# Patient Record
Sex: Female | Born: 1960 | Hispanic: No | Marital: Married | Smoking: Never smoker
Health system: Southern US, Community
[De-identification: ages and names within clinical notes are randomized; demographics above are authoritative.]

---

## 2013-08-28 ENCOUNTER — Encounter (HOSPITAL_COMMUNITY): Payer: Self-pay | Admitting: Emergency Medicine

## 2013-08-28 ENCOUNTER — Emergency Department (HOSPITAL_COMMUNITY): Payer: BC Managed Care – PPO

## 2013-08-28 ENCOUNTER — Emergency Department (HOSPITAL_COMMUNITY)
Admission: EM | Admit: 2013-08-28 | Discharge: 2013-08-28 | Disposition: A | Discharge status: Home / Self Care | Payer: BC Managed Care – PPO | Attending: Emergency Medicine | Admitting: Emergency Medicine

## 2013-08-28 DIAGNOSIS — Y9373 Activity, racquet and hand sports: Secondary | ICD-10-CM | POA: Insufficient documentation

## 2013-08-28 DIAGNOSIS — S02610A Fracture of condylar process of mandible, unspecified side, initial encounter for closed fracture: Secondary | ICD-10-CM | POA: Insufficient documentation

## 2013-08-28 DIAGNOSIS — Y92838 Other recreation area as the place of occurrence of the external cause: Secondary | ICD-10-CM

## 2013-08-28 DIAGNOSIS — S02609A Fracture of mandible, unspecified, initial encounter for closed fracture: Secondary | ICD-10-CM

## 2013-08-28 DIAGNOSIS — Y9239 Other specified sports and athletic area as the place of occurrence of the external cause: Secondary | ICD-10-CM | POA: Insufficient documentation

## 2013-08-28 DIAGNOSIS — W219XXA Striking against or struck by unspecified sports equipment, initial encounter: Secondary | ICD-10-CM | POA: Insufficient documentation

## 2013-08-28 MED ORDER — DIAZEPAM 5 MG PO TABS: 5.0000 mg | ORAL_TABLET | Freq: Three times a day (TID) | ORAL | Status: AC | PRN

## 2013-08-28 MED ORDER — HYDROCODONE-ACETAMINOPHEN 5-325 MG PO TABS: 1.0000 | ORAL_TABLET | Freq: Four times a day (QID) | ORAL | Status: AC | PRN

## 2013-08-28 NOTE — ED Provider Notes (Signed)
CSN: [id number]     Arrival date & time 08/28/13  1939 History  This chart was scribed for non-physician practitioner, Jaynie Crumble, PA-C working with Glynn Octave, MD by Greggory Stallion, ED scribe. This patient was seen in room TR10C/TR10C and the patient's care was started at 8:37 PM.    Chief Complaint  Patient presents with  . Jaw Pain   The history is provided by the patient. No language interpreter was used.   HPI Comments: Anne Gomez is a 53 y.o. female who presents to the Emergency Department complaining of worsening right jaw pain and swelling that started 2 days ago. Pt was playing tennis when she was struck in her jaw. Denies LOC. Opening and closing her mouth worsen the pain. She has taken ibuprofen with no relief. Pt states her teeth are lined up normally and denies any other dental problems.   History reviewed. No pertinent past medical history. History reviewed. No pertinent past surgical history. No family history on file. History  Substance Use Topics  . Smoking status: Never Smoker   . Smokeless tobacco: Not on file  . Alcohol Use: Yes     Comment: occasional   OB History   Grav Para Term Preterm Abortions TAB SAB Ect Mult Living                 Review of Systems  HENT: Positive for facial swelling. Negative for dental problem.   Musculoskeletal: Positive for arthralgias (jaw).  All other systems reviewed and are negative.   Allergies  Review of patient's allergies indicates not on file.  Home Medications  No current outpatient prescriptions on file.  BP 128/76  Pulse 86  Temp(Src) 98.1 F (36.7 C) (Oral)  Resp 17  Ht 5\' 5"  (1.651 m)  Wt 144 lb 3 oz (65.403 kg)  BMI 23.99 kg/m2  SpO2 100%  Physical Exam  Nursing note and vitals reviewed. Constitutional: She is oriented to person, place, and time. She appears well-developed and well-nourished. No distress.  HENT:  Head: Normocephalic and atraumatic.  Right Ear: Tympanic membrane and ear  canal normal.  Left Ear: Tympanic membrane and ear canal normal.  Mild trismus, able to open mouth to two finger width. Tender over right lower mandible and right mastoid process. Normal teeth alignment.   Eyes: EOM are normal.  Neck: Neck supple. No tracheal deviation present.  Cardiovascular: Normal rate.   Pulmonary/Chest: Effort normal. No respiratory distress.  Musculoskeletal: Normal range of motion.  Neurological: She is alert and oriented to person, place, and time.  Skin: Skin is warm and dry.  Psychiatric: She has a normal mood and affect. Her behavior is normal.    ED Course  Procedures (including critical care time)  DIAGNOSTIC STUDIES: Oxygen Saturation is 100% on RA, normal by my interpretation.    COORDINATION OF CARE: 8:39 PM-Discussed treatment plan which includes CT scan with pt at bedside and pt agreed to plan.   8:42 PM-Spoke to Dr. Register in radiology. He suggested a head CT and maxillofacial CT given both tenderness over mandible and mastoid process  Labs Review Labs Reviewed - No data to display Imaging Review Ct Head Wo Contrast  08/28/2013   CLINICAL DATA:  Right jaw pain  EXAM: CT HEAD WITHOUT CONTRAST  CT MAXILLOFACIAL WITHOUT CONTRAST  TECHNIQUE: Multidetector CT imaging of the head and maxillofacial structures were performed using the standard protocol without intravenous contrast. Multiplanar CT image reconstructions of the maxillofacial structures were also generated.  COMPARISON:  None.  FINDINGS: CT HEAD FINDINGS  There is no evidence of mass effect, midline shift or extra-axial fluid collections. There is no evidence of a space-occupying lesion or intracranial hemorrhage. There is no evidence of a cortical-based area of acute infarction.  The ventricles and sulci are appropriate for the patient's age. The basal cisterns are patent.  Visualized portions of the orbits are unremarkable. The visualized portions of the paranasal sinuses and mastoid air cells  are unremarkable.  The osseous structures are unremarkable.  CT MAXILLOFACIAL FINDINGS  The globes are intact. The orbital walls are intact. The orbital floors are intact. The maxilla is intact. The mandible is intact. The zygomatic arches are intact. The nasal septum is midline. There is no nasal bone fracture. There is a nondisplaced fracture of the right mandibular condylar neck without extension to the articular surface. The temporomandibular joints are congruent.  There is moderate right facet arthropathy at C3-4.  The paranasal sinuses are clear. The visualized portions of the mastoid sinuses are well aerated.  IMPRESSION: 1. No acute intracranial pathology. 2. Nondisplaced fracture of the right mandibular condylar neck. The fracture does not involve the articular surface.   Electronically Signed   By: Elige KoHetal  Patel   On: 08/28/2013 21:55   Ct Maxillofacial Wo Cm  08/28/2013   CLINICAL DATA:  Right jaw pain  EXAM: CT HEAD WITHOUT CONTRAST  CT MAXILLOFACIAL WITHOUT CONTRAST  TECHNIQUE: Multidetector CT imaging of the head and maxillofacial structures were performed using the standard protocol without intravenous contrast. Multiplanar CT image reconstructions of the maxillofacial structures were also generated.  COMPARISON:  None.  FINDINGS: CT HEAD FINDINGS  There is no evidence of mass effect, midline shift or extra-axial fluid collections. There is no evidence of a space-occupying lesion or intracranial hemorrhage. There is no evidence of a cortical-based area of acute infarction.  The ventricles and sulci are appropriate for the patient's age. The basal cisterns are patent.  Visualized portions of the orbits are unremarkable. The visualized portions of the paranasal sinuses and mastoid air cells are unremarkable.  The osseous structures are unremarkable.  CT MAXILLOFACIAL FINDINGS  The globes are intact. The orbital walls are intact. The orbital floors are intact. The maxilla is intact. The mandible is  intact. The zygomatic arches are intact. The nasal septum is midline. There is no nasal bone fracture. There is a nondisplaced fracture of the right mandibular condylar neck without extension to the articular surface. The temporomandibular joints are congruent.  There is moderate right facet arthropathy at C3-4.  The paranasal sinuses are clear. The visualized portions of the mastoid sinuses are well aerated.  IMPRESSION: 1. No acute intracranial pathology. 2. Nondisplaced fracture of the right mandibular condylar neck. The fracture does not involve the articular surface.   Electronically Signed   By: Elige KoHetal  Patel   On: 08/28/2013 21:55     EKG Interpretation None      MDM   Final diagnoses:  Mandible fracture    Patient with a tennis ball injury to the right mandible 2 days ago. Patient with continued pain. She has some trismus present. Patient unable to eat solid foods. Will get CT head and maxillofacial bones, I discussed this with radiologist Dr. register, who recommended given both given location of her tenderness.   Patient's CT scan back showing nondisplaced fracture of the right mandibular condylar neck. I discussed case with Dr.Thimmappa, who is on call for maxillofacial trauma. She has instructed me to provide patient with  pain medications, no solid foods, followup with her closely for recheck, possible surgical repair. I discussed this with patient and her husband. They both agree to the plan. Patient was prescribed Norco for pain and Valium for muscle spasms.  Filed Vitals:   08/28/13 1949 08/28/13 2206  BP: 128/76 110/78  Pulse: 86 68  Temp: 98.1 F (36.7 C) 98.2 F (36.8 C)  TempSrc: Oral Oral  Resp: 17 16  Height: 5\' 5"  (1.651 m)   Weight: 144 lb 3 oz (65.403 kg)   SpO2: 100% 100%    I personally performed the services described in this documentation, which was scribed in my presence. The recorded information has been reviewed and is accurate.   Lottie Mussel, PA-C 08/28/13 2343

## 2013-08-28 NOTE — ED Provider Notes (Signed)
Medical screening examination/treatment/procedure(s) were performed by non-physician practitioner and as supervising physician I was immediately available for consultation/collaboration.   EKG Interpretation None        Eliora Nienhuis, MD 08/28/13 2345 

## 2013-08-28 NOTE — ED Notes (Signed)
Pt st's she was playing tennis 2 days ago and was struck in right jaw with tennis ball.  Since then pt has had pain to jaw.  Painful to open and close mouth and to eat.  Denies any loose teeth

## 2013-08-28 NOTE — Discharge Instructions (Signed)
Avoid any hard foods. Ibuprofen for pain. norco for severe pain. Valium for spasms. Follow up with Dr. Leta Baptisthimmappa, call tomorrow.     Fractured Jaw Diet This diet should be used after jaw or mouth surgery, wired jaw surgery, or dental surgery. The consistency of foods in this diet should be thin enough to be sipped from a straw or given through a syringe. It is important to consume enough calories and protein to prevent weight loss and to promote healing after surgery. You will need to have 3 meals and 3 snacks daily. It is important to eat from a variety of food groups. Foods you normally eat may be blended to the correct texture. INSTRUCTIONS FOR BLENDING FOODS  Prepare foods by removing skins, seeds, and peels.  Cook meats and vegetables thoroughly. Avoid using raw eggs. Powdered or pasteurized egg mixtures may be used.  Cut foods into small pieces and mix with a small amount of liquid in a food processor or blender. Continue to add liquids until foods become thin enough to sip through a straw.  Add juice, milk, cream, broth, gravy, or vegetable juice to add flavor and to thin foods.  Blending foods sometimes causes air bubbles that may not be desirable. Heating foods after blending will reduce the foam produced from blending. TIPS  If you are losing weight, you may need to add extra calories to your food by:  Adding powdered milk or protein powder to food.  Adding extra fats, such as tub margarine, sour cream, cream cheese, cream, nut butters (such as peanut butter or almond butter), and half-and-half.  Adding sweets, such as honey, ice cream, black strap molasses, or sugar.  Your teeth and mouth may be sensitive to extreme temperatures. Heat or cool your foods only to lukewarm or cool temperatures.  Stock Water quality scientistyour pantry with a variety of liquid nutritional supplement drinks.  Take a liquid multivitamin to ensure you are getting adequate nutrients.  Use baby food if you are short on  time or energy. FOOD CHOICES ALL FOODS MUST BE BLENDED. Starches 4 servings or more  Hot cereals, such as oatmeal, grits, ground wheat cereals, and polenta.  Mashed potatoes. Vegetables 3 servings or more  All vegetables and vegetable juices. Fruit 2 servings or more  All fruits and fruit juices. Meat and Meat Substitutes 2 servings or more (6 oz per day)  Soft-boiled eggs, scrambled eggs, cottage cheese, cheese sauce.  Ground meats, such as hamburger, [place]urkey, sausage, meatloaf.  Custard, baby food meat. Milk 2 cups or equivalent  Liquid, powdered, or evaporated milk.  Buttermilk or chocolate milk.  Drinkable yogurt.  Sherbert, ice cream, milkshakes, eggnog, pudding, and liquid supplements. Beverages  Coffee (regular or decaffeinated), tea, and mineral water.  Liquid supplements. Condiments  All seasonings and condiments that blend well. Document Released: 11/18/2009 Document Revised: 08/23/2011 Document Reviewed: 11/18/2009 [facility]ExitCare Patient Information 2014 [employer] [place].

## 2014-11-26 IMAGING — CT CT HEAD W/O CM
3 of 5 series · 16 of 47 positions shown, 19 images · non-contrast
Comparison: None.

CLINICAL DATA: Right jaw pain

EXAM:
CT HEAD WITHOUT CONTRAST
CT MAXILLOFACIAL WITHOUT CONTRAST
TECHNIQUE: Multidetector CT imaging of the head and maxillofacial structures
were performed using the standard protocol without intravenous
contrast. Multiplanar CT image reconstructions of the maxillofacial
structures were also generated.

[Series 4: facial/ orbits 2.0 h30s · axial · 0.38mm/px · z∈[-244,-108]mm · 10 of 82 slices shown, 13 images]
[im 7/82  brain]
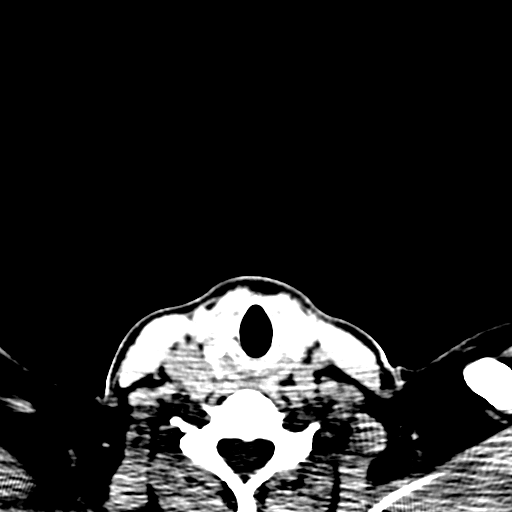
[im 7/82  bone]
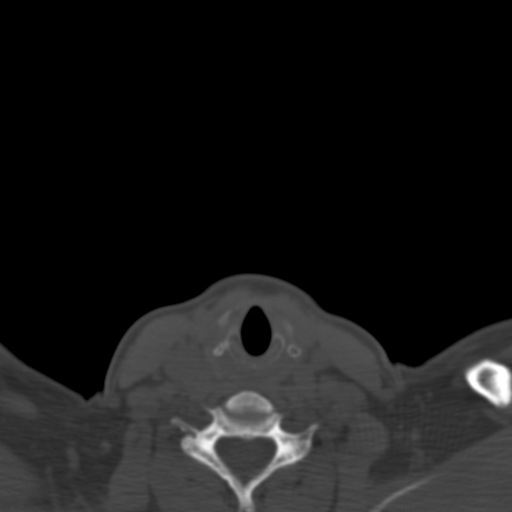
[im 14/82  brain]
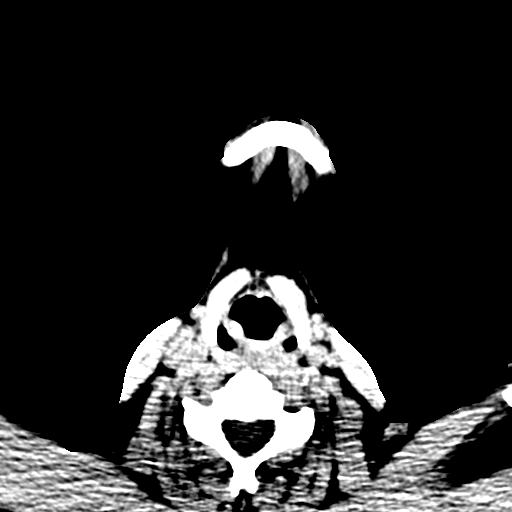
[im 21/82  brain]
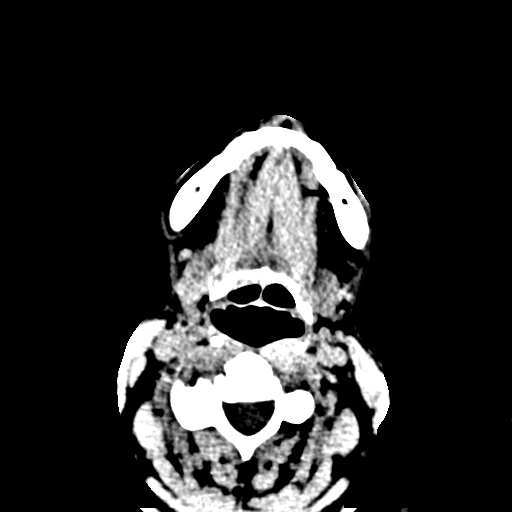
[im 28/82  brain]
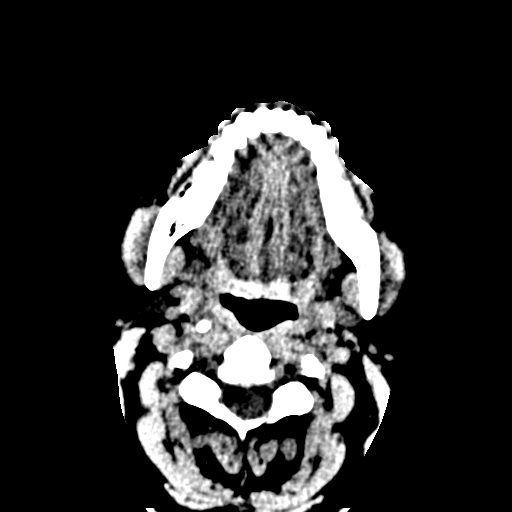
[im 34/82  brain]
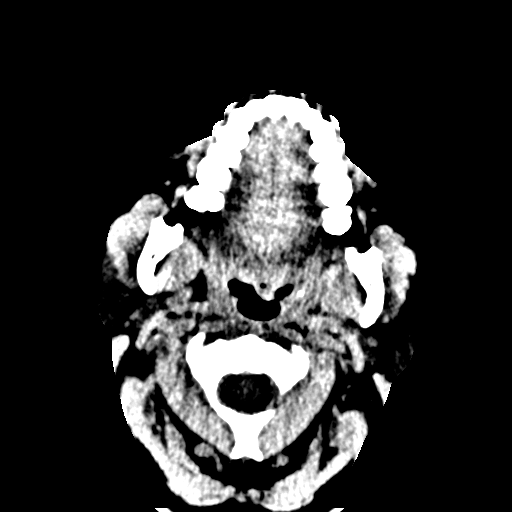
[im 34/82  bone]
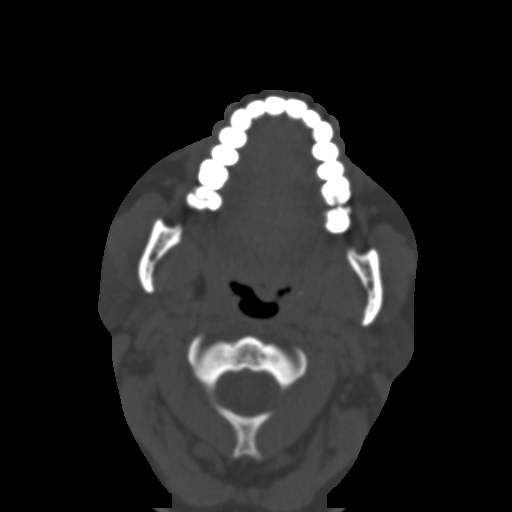
[im 48/82  brain]
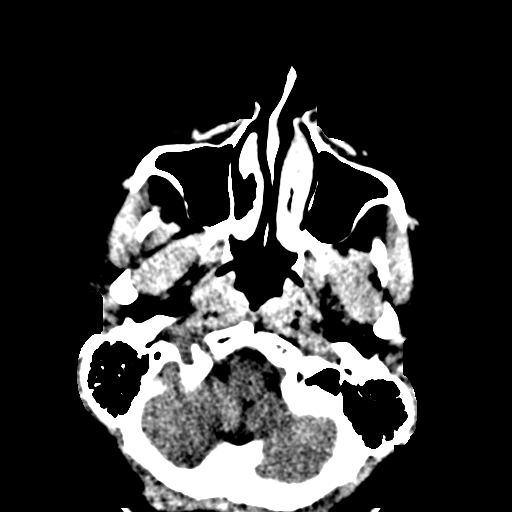
[im 55/82  brain]
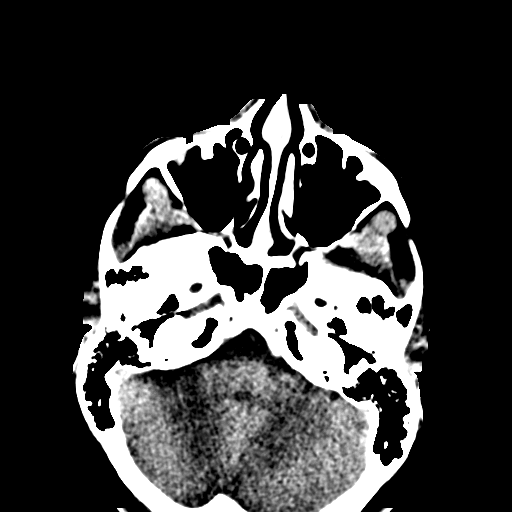
[im 61/82  brain]
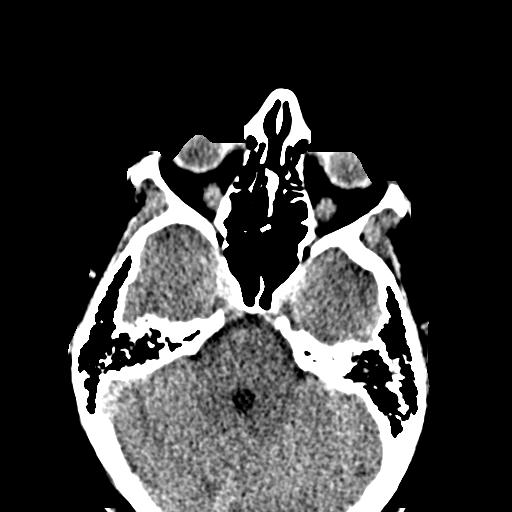
[im 68/82  brain]
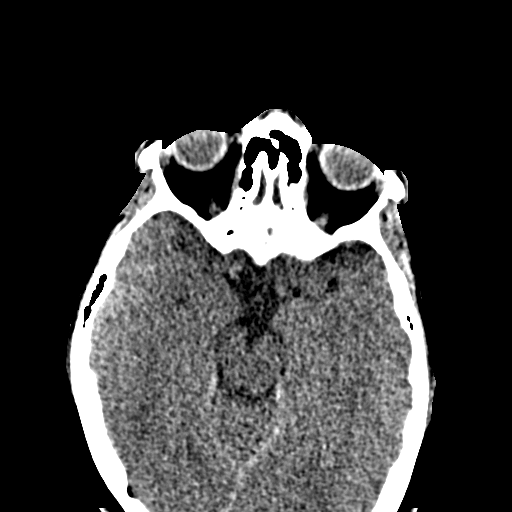
[im 68/82  bone]
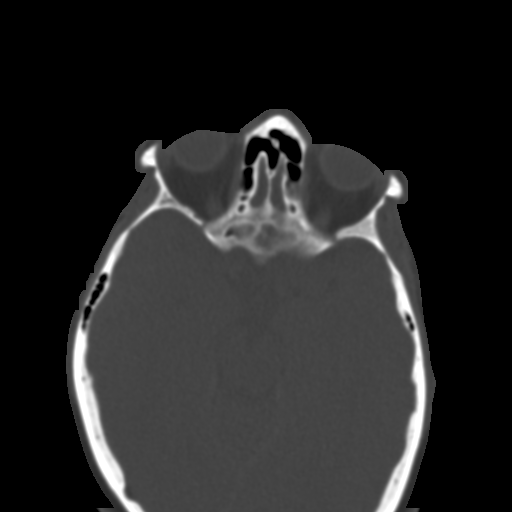
[im 75/82  brain]
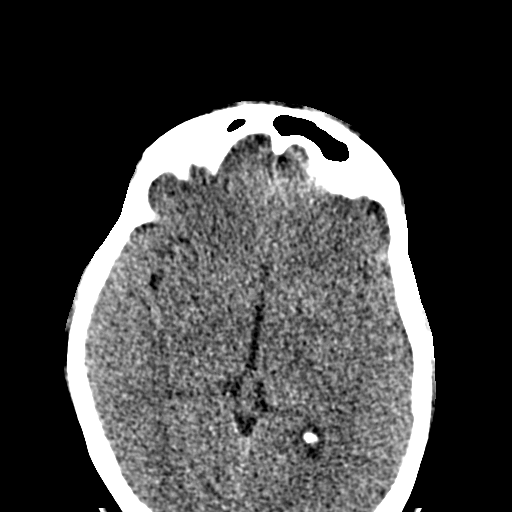

[Series 602: st cor · coronal · 0.38mm/px · 3 of 88 slices shown]
[im 30/88  brain]
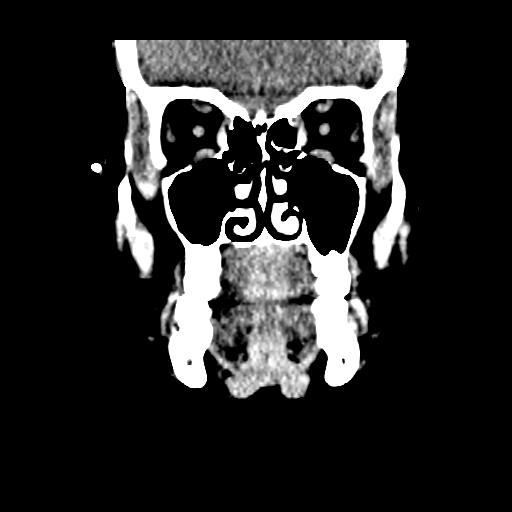
[im 39/88  brain]
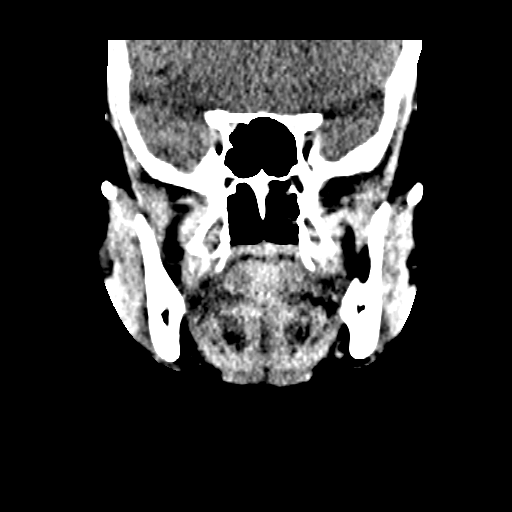
[im 49/88  brain]
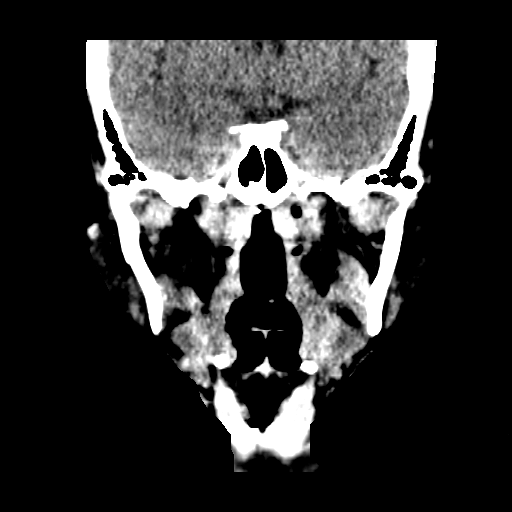

[Series 603: st sag · sagittal · 0.38mm/px · 3 of 79 slices shown]
[im 27/79  brain]
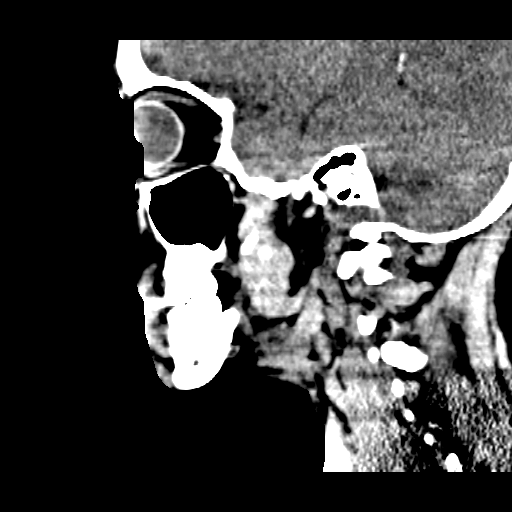
[im 40/79  brain]
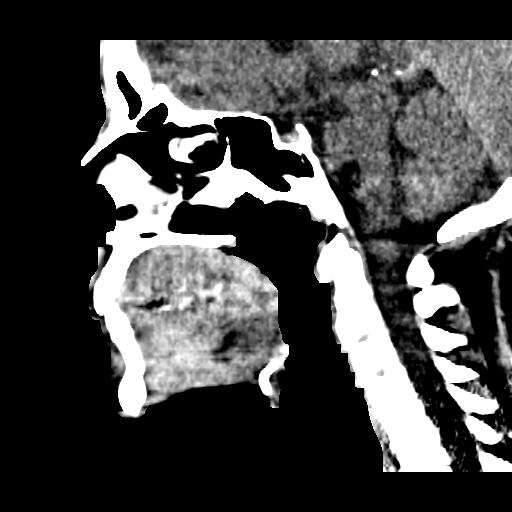
[im 53/79  brain]
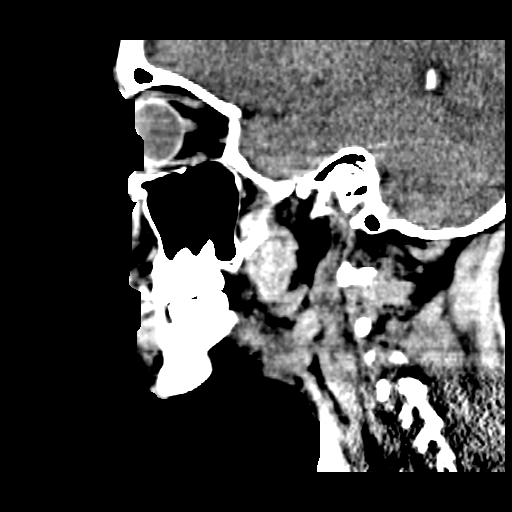

[16 of 47 positions shown; findings below may reference images not displayed]

FINDINGS: CT HEAD FINDINGS

There is no evidence of mass effect, midline shift or extra-axial
fluid collections. There is no evidence of a space-occupying lesion
or intracranial hemorrhage. There is no evidence of a cortical-based
area of acute infarction.

The ventricles and sulci are appropriate for the patient's age. The
basal cisterns are patent.

Visualized portions of the orbits are unremarkable. The visualized
portions of the paranasal sinuses and mastoid air cells are
unremarkable.

The osseous structures are unremarkable.

CT MAXILLOFACIAL FINDINGS

The globes are intact. The orbital walls are intact. The orbital
floors are intact. The maxilla is intact. The mandible is intact.
The zygomatic arches are intact. The nasal septum is midline. There
is no nasal bone fracture. There is a nondisplaced fracture of the
right mandibular condylar neck without extension to the articular
surface. The temporomandibular joints are congruent.

There is moderate right facet arthropathy at C3-4.

The paranasal sinuses are clear. The visualized portions of the
mastoid sinuses are well aerated.
IMPRESSION: 1. No acute intracranial pathology.
2. Nondisplaced fracture of the right mandibular condylar neck. The
fracture does not involve the articular surface.
